# Patient Record
Sex: Male | Born: 1975 | Race: White | Hispanic: No | Marital: Married | Smoking: Former smoker
Health system: Southern US, Community
[De-identification: ages and names within clinical notes are randomized; demographics above are authoritative.]

## PROBLEM LIST (undated history)

## (undated) DIAGNOSIS — M109 Gout, unspecified: Secondary | ICD-10-CM

## (undated) DIAGNOSIS — F329 Major depressive disorder, single episode, unspecified: Secondary | ICD-10-CM

## (undated) DIAGNOSIS — F32A Depression, unspecified: Secondary | ICD-10-CM

## (undated) DIAGNOSIS — I10 Essential (primary) hypertension: Secondary | ICD-10-CM

## (undated) HISTORY — PX: COLON SURGERY: SHX602

---

## 2004-06-16 ENCOUNTER — Emergency Department: Payer: Self-pay | Admitting: Emergency Medicine

## 2004-10-18 ENCOUNTER — Ambulatory Visit: Payer: Self-pay | Admitting: Specialist

## 2006-05-06 IMAGING — CT CT ABD-PELV W/O CM
1 of 3 series · 15 of 32 positions shown, 19 images · non-contrast
Comparison: none

REASON FOR EXAM: Abdominal pain, rule out kidney stone
COMMENTS:

[Series 2: stone · axial · 0.75mm/px · z∈[-581,-152]mm · 15 of 159 slices shown, 19 images]
[im 8/159  soft-tissue]
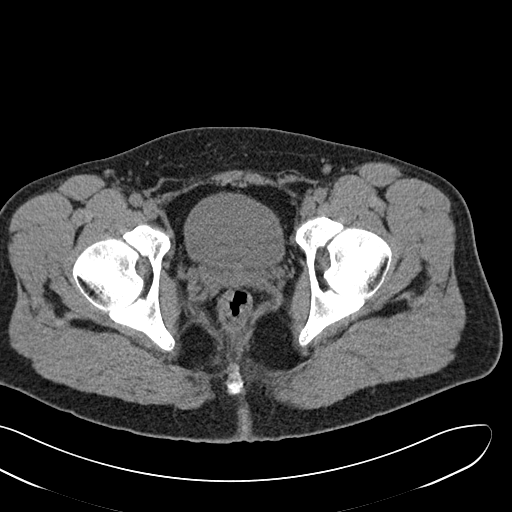
[im 8/159  bone]
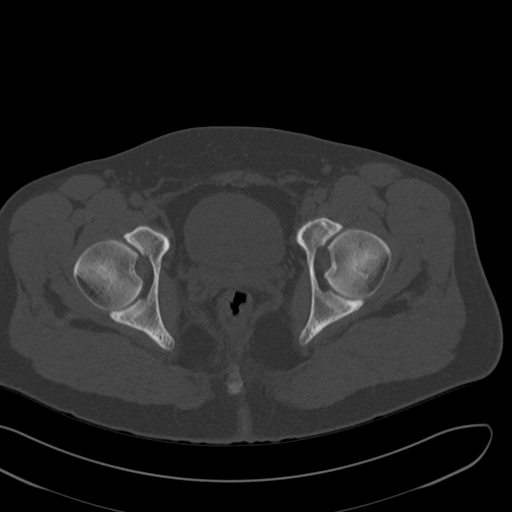
[im 22/159  soft-tissue]
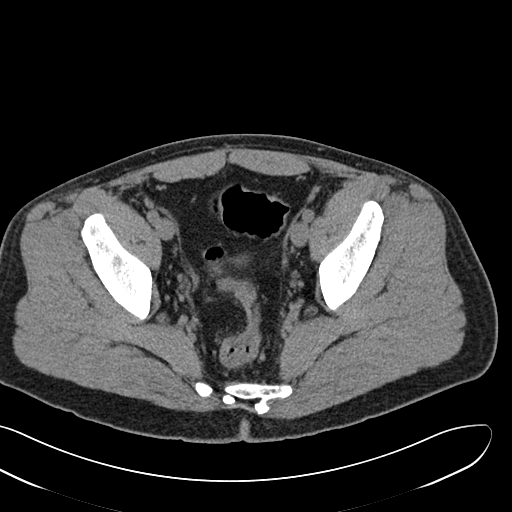
[im 36/159  soft-tissue]
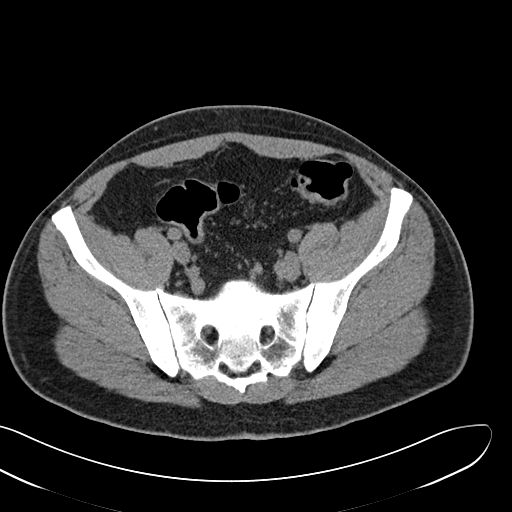
[im 44/159  soft-tissue]
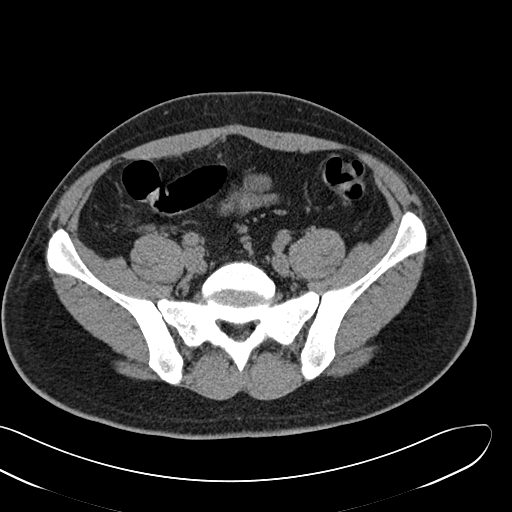
[im 58/159  soft-tissue]
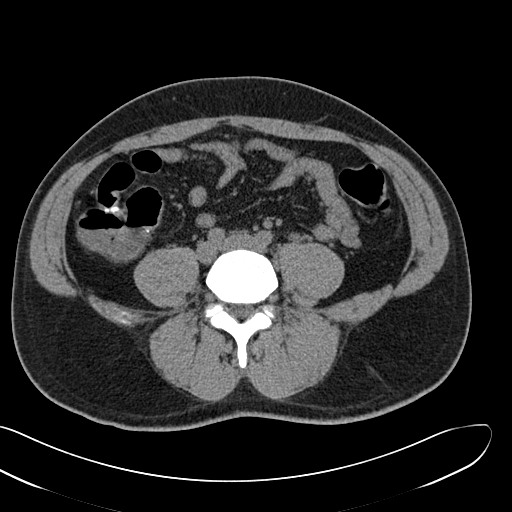
[im 65/159  soft-tissue]
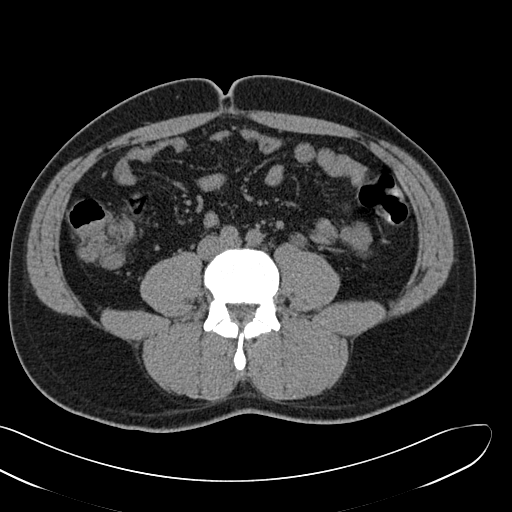
[im 80/159  soft-tissue]
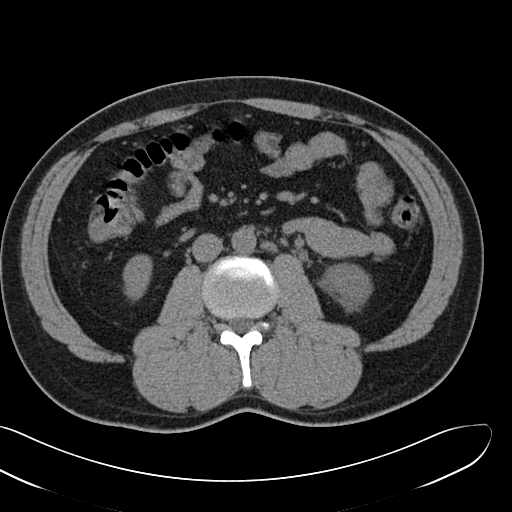
[im 94/159  soft-tissue]
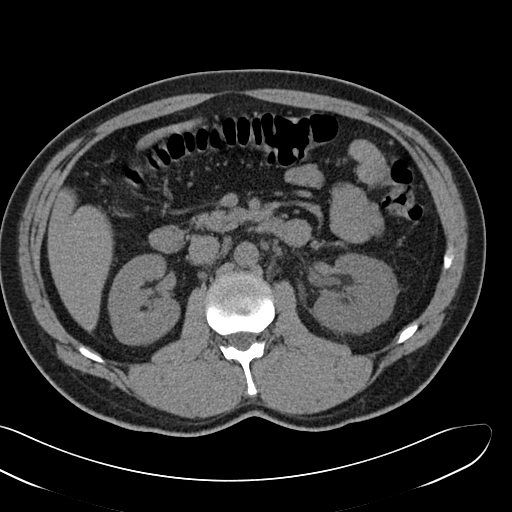
[im 101/159  soft-tissue]
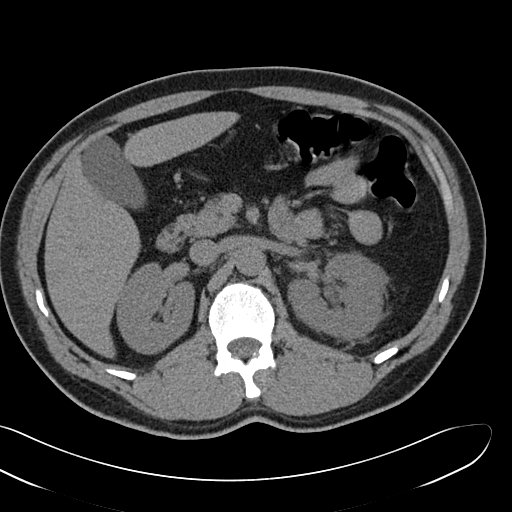
[im 101/159  bone]
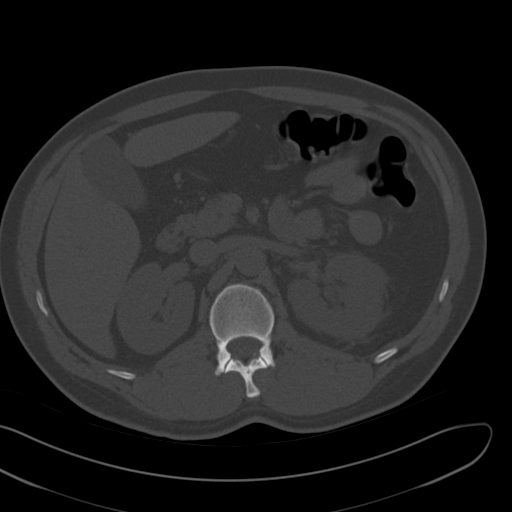
[im 115/159  soft-tissue]
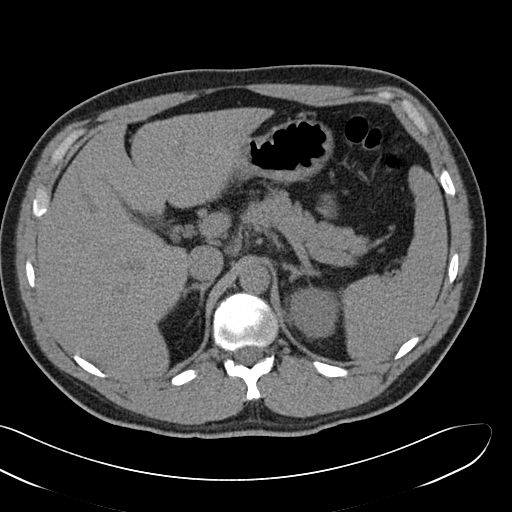
[im 123/159  soft-tissue]
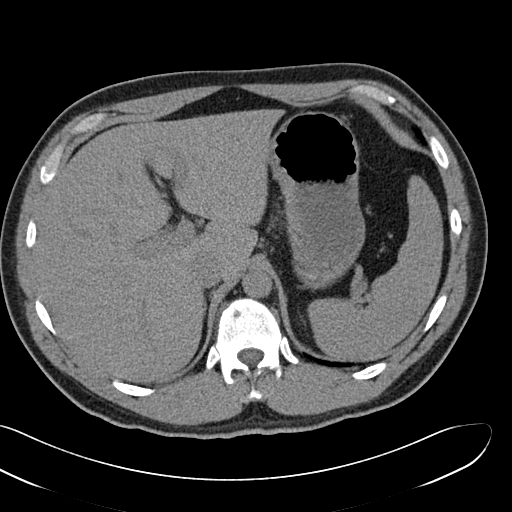
[im 130/159  lung]
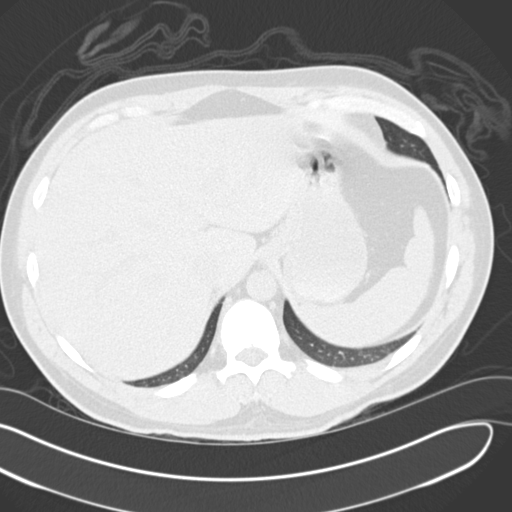
[im 137/159  soft-tissue]
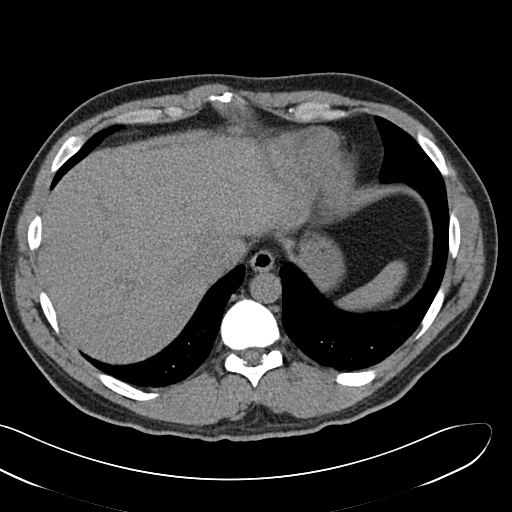
[im 137/159  lung]
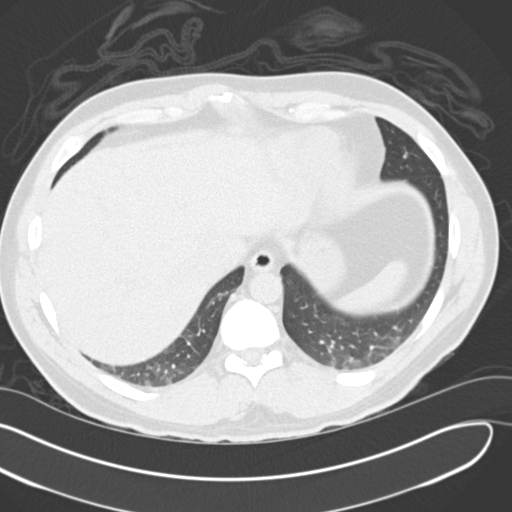
[im 144/159  lung]
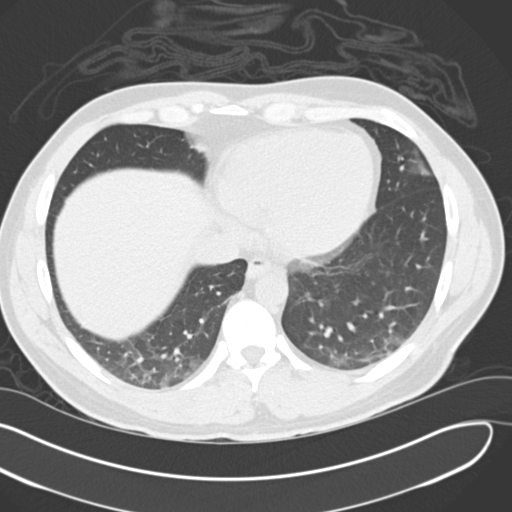
[im 151/159  soft-tissue]
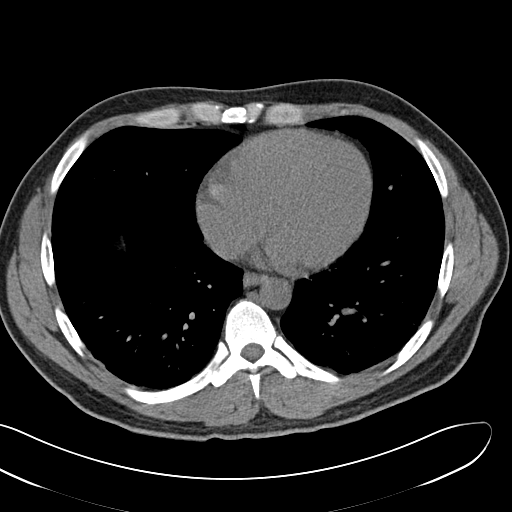
[im 151/159  lung]
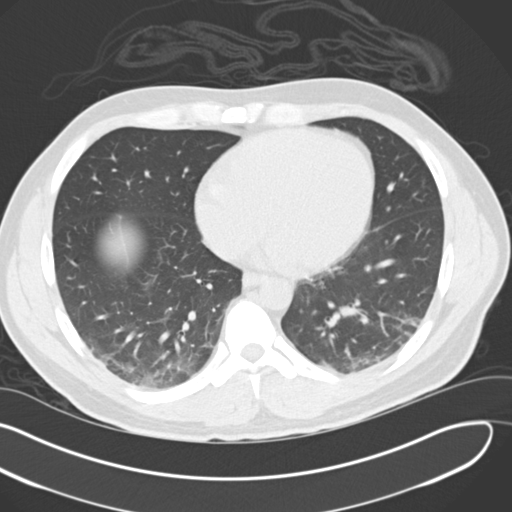

[15 of 32 positions shown; findings below may reference images not displayed]

PROCEDURE:     CT  - CT ABDOMEN AND PELVIS W[DATE]  [DATE]

RESULT:     Spiral noncontrast 3.0 mm sections were obtained from the lung
bases through the pubic symphysis.

Evaluation of the lung bases demonstrates hypoventilation versus scarring
within the posterior RIGHT and LEFT lung bases.

Evaluation of the LEFT kidney demonstrates mild to moderate
pelvocaliectasis, more mild than moderate. A small, non-obstructing, 1.0 to
2.0 mm calculus is demonstrated within the mid pole posterior medullary
portion of the LEFT kidney. There is mild prominence of the LEFT ureter and
projecting in the region of the distal LEFT ureter, at the level of the
pelvic inlet, is a 5.6 mm calcified density consistent with a distal LEFT
ureteral calculus.

Noncontrast evaluation of the liver, spleen, pancreas, adrenals and RIGHT
kidney are unremarkable. There is no evidence of abdominal or pelvic free
fluid, drainable loculated fluid collections, masses or adenopathy.
IMPRESSION: 1.     Findings consistent with a distal LEFT ureteral calculus with
associated mild to moderate (more mild than moderate) obstructive uropathy.
2.     Note, not mentioned above, inflammatory change is appreciated
surrounding the LEFT kidney consistent with the previously described
findings.
3.     Hypoventilation versus atelectasis and/or scarring within the lung
bases.
4.     Dr. Stainless of the Emergency Department was informed of these findings
at the time of the interpretation.

## 2006-09-07 IMAGING — CR DG ABDOMEN 1V
1 series · 2 of 2 positions shown · non-contrast
Comparison: none

REASON FOR EXAM: renal calculi-lithotripsy
COMMENTS:

[Series 1: view not recorded · 0.17mm/px · 2 of 2 slices shown]
[im 1/2]
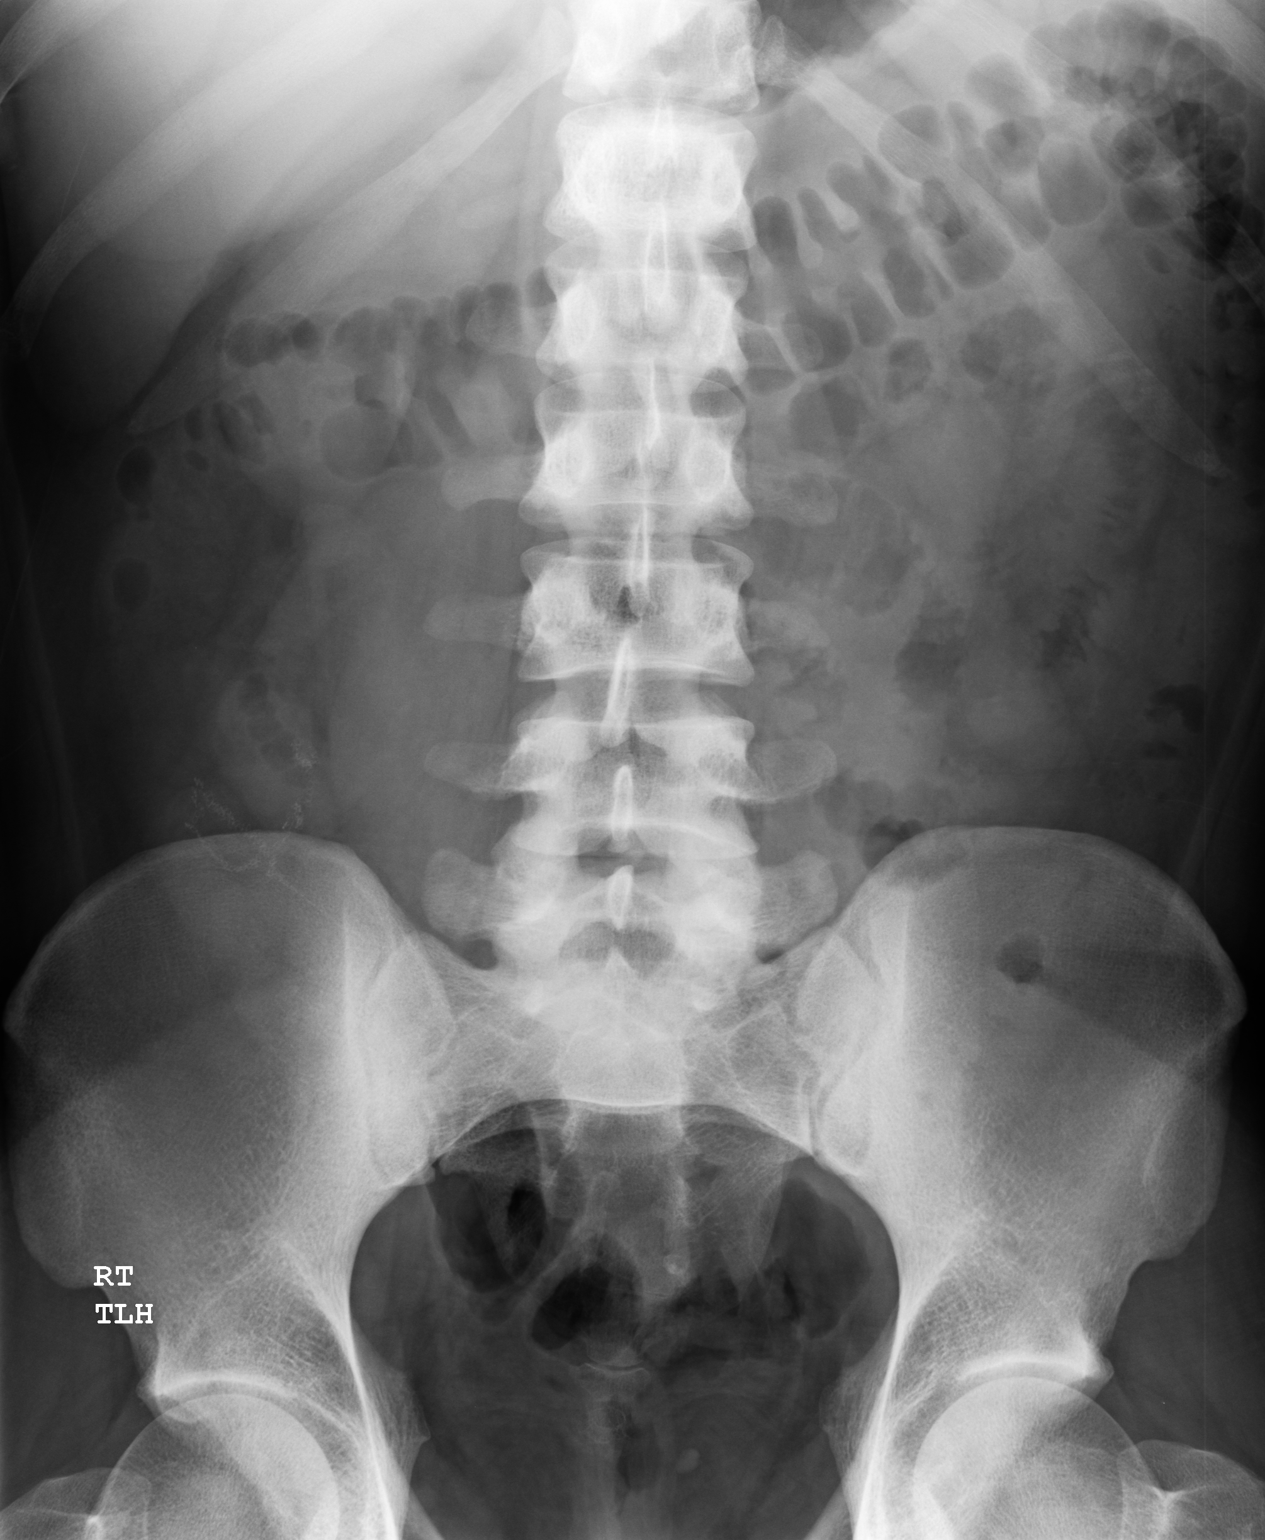
[im 2/2]
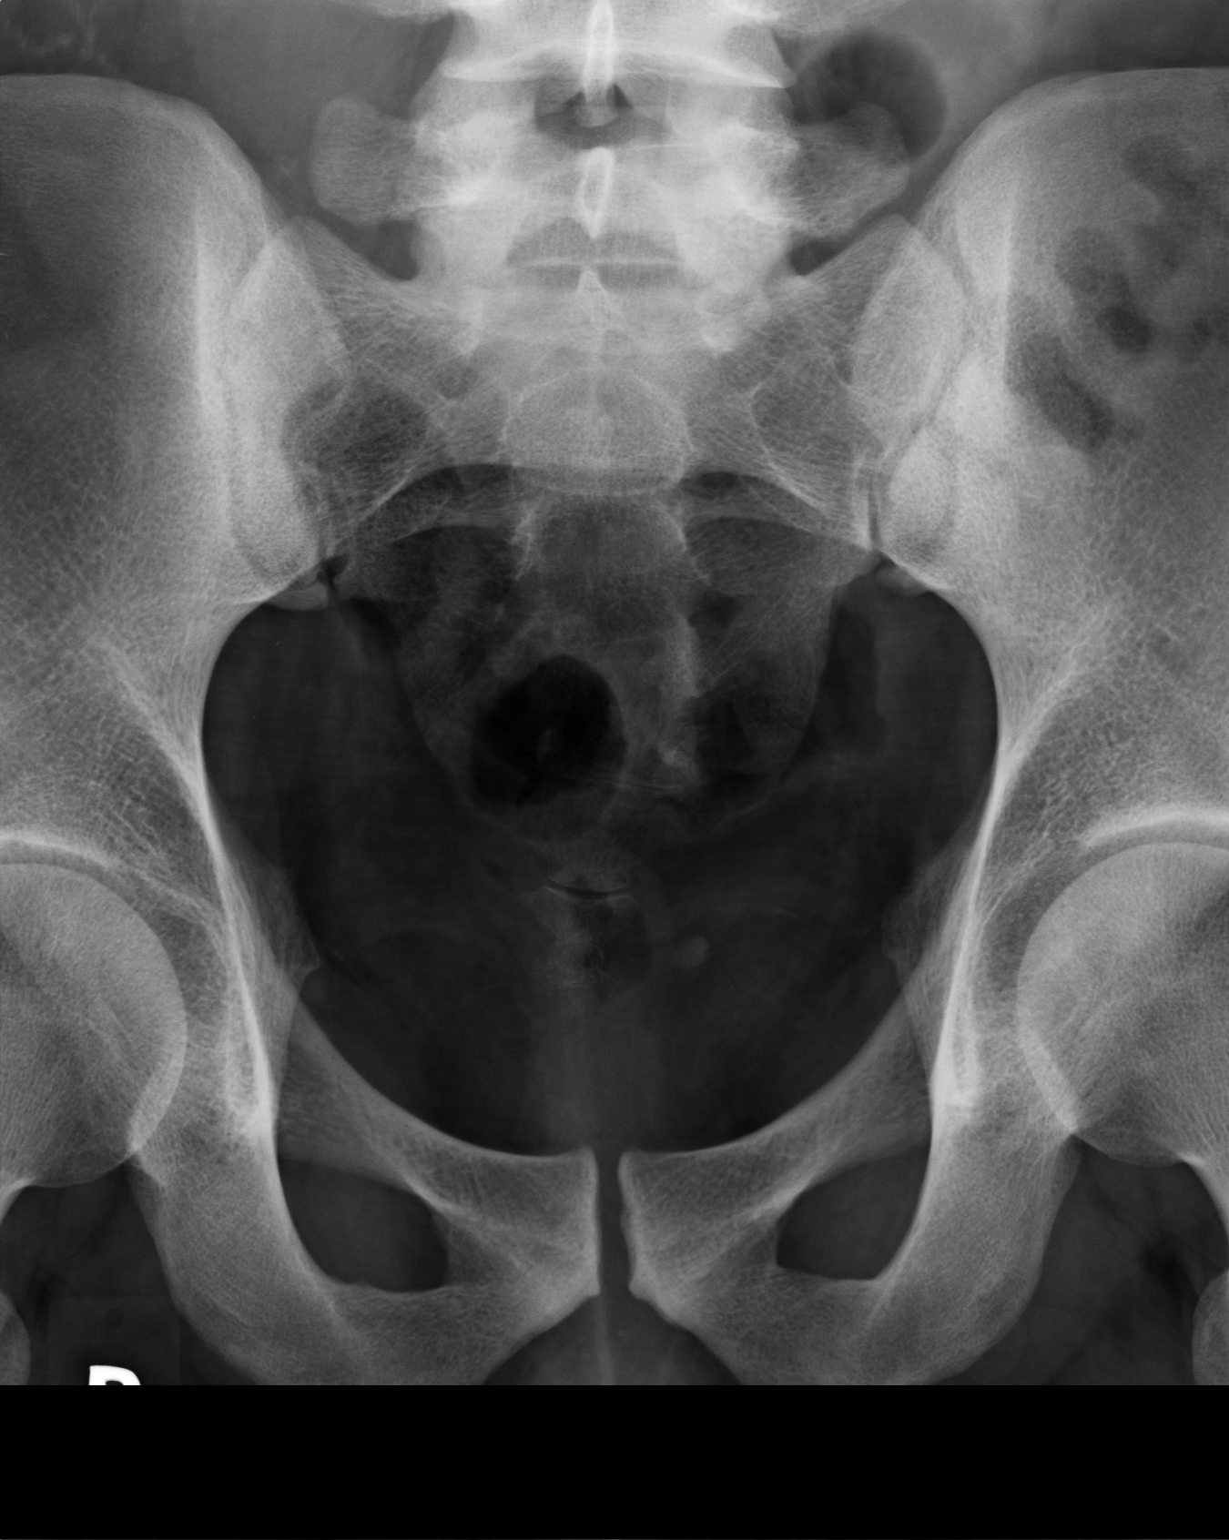

[2 of 2 positions shown; findings below may reference images not displayed]

PROCEDURE:     DXR - DXR KIDNEY URETER BLADDER  - October 18, 2004 [DATE]

RESULT:     Soft tissue structures are unremarkable.  The gas pattern is
nonspecific.  Surgical sutures are noted in the RIGHT lower quadrant.  No
bony abnormality is identified.  A rounded calcification is noted in the
LEFT pelvis.  This could be a distal LEFT ureteral stone or bladder stone.
This rounded calcification measures 8 mm.

TER/lac
IMPRESSION: See above

## 2016-05-20 ENCOUNTER — Other Ambulatory Visit: Payer: Self-pay | Admitting: Physician Assistant

## 2016-05-20 DIAGNOSIS — N281 Cyst of kidney, acquired: Secondary | ICD-10-CM

## 2016-05-27 ENCOUNTER — Other Ambulatory Visit: Payer: Self-pay | Admitting: Urology

## 2016-05-27 DIAGNOSIS — N281 Cyst of kidney, acquired: Secondary | ICD-10-CM

## 2016-05-29 ENCOUNTER — Ambulatory Visit
Admission: RE | Admit: 2016-05-29 | Discharge: 2016-05-29 | Disposition: A | Discharge status: Home / Self Care | Payer: BLUE CROSS/BLUE SHIELD | Source: Ambulatory Visit | Attending: Urology | Admitting: Urology

## 2016-05-29 DIAGNOSIS — N132 Hydronephrosis with renal and ureteral calculous obstruction: Secondary | ICD-10-CM | POA: Insufficient documentation

## 2016-05-29 DIAGNOSIS — N281 Cyst of kidney, acquired: Secondary | ICD-10-CM | POA: Insufficient documentation

## 2016-05-29 DIAGNOSIS — N201 Calculus of ureter: Secondary | ICD-10-CM | POA: Diagnosis not present

## 2016-05-29 DIAGNOSIS — N23 Unspecified renal colic: Secondary | ICD-10-CM | POA: Insufficient documentation

## 2016-05-29 MED ORDER — GADOBENATE DIMEGLUMINE 529 MG/ML IV SOLN
20.0000 mL | Freq: Once | INTRAVENOUS | Status: AC | PRN
  Administered 2016-05-29: 20 mL via INTRAVENOUS

## 2016-10-08 ENCOUNTER — Encounter: Payer: Self-pay | Admitting: *Deleted

## 2016-10-08 ENCOUNTER — Ambulatory Visit: Payer: BLUE CROSS/BLUE SHIELD | Admitting: Anesthesiology

## 2016-10-08 ENCOUNTER — Ambulatory Visit
Admission: RE | Admit: 2016-10-08 | Discharge: 2016-10-08 | Disposition: A | Discharge status: Home / Self Care | Payer: BLUE CROSS/BLUE SHIELD | Source: Ambulatory Visit | Attending: Urology | Admitting: Urology

## 2016-10-08 ENCOUNTER — Encounter
Admission: RE | Disposition: A | Discharge status: Home / Self Care | Payer: Self-pay | Source: Ambulatory Visit | Attending: Urology

## 2016-10-08 DIAGNOSIS — I10 Essential (primary) hypertension: Secondary | ICD-10-CM | POA: Insufficient documentation

## 2016-10-08 DIAGNOSIS — N136 Pyonephrosis: Secondary | ICD-10-CM | POA: Insufficient documentation

## 2016-10-08 DIAGNOSIS — F329 Major depressive disorder, single episode, unspecified: Secondary | ICD-10-CM | POA: Insufficient documentation

## 2016-10-08 DIAGNOSIS — Z79899 Other long term (current) drug therapy: Secondary | ICD-10-CM | POA: Diagnosis not present

## 2016-10-08 DIAGNOSIS — Z87442 Personal history of urinary calculi: Secondary | ICD-10-CM | POA: Insufficient documentation

## 2016-10-08 DIAGNOSIS — N132 Hydronephrosis with renal and ureteral calculous obstruction: Secondary | ICD-10-CM | POA: Diagnosis present

## 2016-10-08 DIAGNOSIS — M109 Gout, unspecified: Secondary | ICD-10-CM | POA: Diagnosis not present

## 2016-10-08 DIAGNOSIS — K5090 Crohn's disease, unspecified, without complications: Secondary | ICD-10-CM | POA: Insufficient documentation

## 2016-10-08 DIAGNOSIS — Z87891 Personal history of nicotine dependence: Secondary | ICD-10-CM | POA: Diagnosis not present

## 2016-10-08 HISTORY — DX: Gout, unspecified: M10.9

## 2016-10-08 HISTORY — PX: URETEROSCOPY WITH HOLMIUM LASER LITHOTRIPSY: SHX6645

## 2016-10-08 HISTORY — DX: Depression, unspecified: F32.A

## 2016-10-08 HISTORY — DX: Major depressive disorder, single episode, unspecified: F32.9

## 2016-10-08 HISTORY — DX: Essential (primary) hypertension: I10

## 2016-10-08 HISTORY — PX: CYSTOSCOPY WITH STENT PLACEMENT: SHX5790

## 2016-10-08 SURGERY — URETEROSCOPY, WITH LITHOTRIPSY USING HOLMIUM LASER
Anesthesia: General | Laterality: Right | Wound class: Clean Contaminated

## 2016-10-08 MED ORDER — ONDANSETRON HCL 4 MG/2ML IJ SOLN
INTRAMUSCULAR | Status: AC
  Filled 2016-10-08: qty 2

## 2016-10-08 MED ORDER — LACTATED RINGERS IV SOLN
INTRAVENOUS | Status: DC
  Administered 2016-10-08 (×2): 50 mL/h via INTRAVENOUS
  Administered 2016-10-08: 13:00:00 via INTRAVENOUS

## 2016-10-08 MED ORDER — CEFAZOLIN SODIUM-DEXTROSE 1-4 GM/50ML-% IV SOLN
1.0000 g | Freq: Once | INTRAVENOUS | Status: AC
  Administered 2016-10-08: 1 g via INTRAVENOUS

## 2016-10-08 MED ORDER — ROCURONIUM BROMIDE 50 MG/5ML IV SOLN
INTRAVENOUS | Status: AC
  Filled 2016-10-08: qty 1

## 2016-10-08 MED ORDER — MIDAZOLAM HCL 2 MG/2ML IJ SOLN
INTRAMUSCULAR | Status: DC | PRN
  Administered 2016-10-08: 2 mg via INTRAVENOUS

## 2016-10-08 MED ORDER — LIDOCAINE HCL 2 % EX GEL
CUTANEOUS | Status: AC
  Filled 2016-10-08: qty 10

## 2016-10-08 MED ORDER — HYOSCYAMINE SULFATE SL 0.125 MG SL SUBL: 0.1250 mg | SUBLINGUAL_TABLET | SUBLINGUAL | 2 refills | Status: AC | PRN

## 2016-10-08 MED ORDER — DOCUSATE SODIUM 100 MG PO CAPS: 200.0000 mg | ORAL_CAPSULE | Freq: Two times a day (BID) | ORAL | 3 refills | Status: AC

## 2016-10-08 MED ORDER — FENTANYL CITRATE (PF) 100 MCG/2ML IJ SOLN
INTRAMUSCULAR | Status: AC
  Filled 2016-10-08: qty 2

## 2016-10-08 MED ORDER — LIDOCAINE HCL (PF) 2 % IJ SOLN
INTRAMUSCULAR | Status: AC
  Filled 2016-10-08: qty 2

## 2016-10-08 MED ORDER — PROMETHAZINE HCL 25 MG RE SUPP: 25.0000 mg | Freq: Four times a day (QID) | RECTAL | 3 refills | Status: AC | PRN

## 2016-10-08 MED ORDER — FENTANYL CITRATE (PF) 100 MCG/2ML IJ SOLN
INTRAMUSCULAR | Status: DC | PRN
  Administered 2016-10-08: 100 ug via INTRAVENOUS
  Administered 2016-10-08: 50 ug via INTRAVENOUS

## 2016-10-08 MED ORDER — PROPOFOL 10 MG/ML IV BOLUS
INTRAVENOUS | Status: DC | PRN
  Administered 2016-10-08: 200 mg via INTRAVENOUS

## 2016-10-08 MED ORDER — PHENYLEPHRINE HCL 10 MG/ML IJ SOLN
INTRAMUSCULAR | Status: DC | PRN
  Administered 2016-10-08: 100 ug via INTRAVENOUS

## 2016-10-08 MED ORDER — CEFAZOLIN SODIUM-DEXTROSE 1-4 GM/50ML-% IV SOLN
INTRAVENOUS | Status: AC
  Filled 2016-10-08: qty 50

## 2016-10-08 MED ORDER — KETOROLAC TROMETHAMINE 30 MG/ML IJ SOLN
INTRAMUSCULAR | Status: DC | PRN
  Administered 2016-10-08: 30 mg via INTRAVENOUS

## 2016-10-08 MED ORDER — FENTANYL CITRATE (PF) 100 MCG/2ML IJ SOLN: 25.0000 ug | INTRAMUSCULAR | Status: DC | PRN

## 2016-10-08 MED ORDER — MIDAZOLAM HCL 2 MG/2ML IJ SOLN
INTRAMUSCULAR | Status: AC
  Filled 2016-10-08: qty 2

## 2016-10-08 MED ORDER — TAPENTADOL HCL 50 MG PO TABS
50.0000 mg | ORAL_TABLET | Freq: Four times a day (QID) | ORAL | Status: DC | PRN
  Administered 2016-10-08: 50 mg via ORAL

## 2016-10-08 MED ORDER — DEXAMETHASONE SODIUM PHOSPHATE 10 MG/ML IJ SOLN
INTRAMUSCULAR | Status: DC | PRN
  Administered 2016-10-08: 10 mg via INTRAVENOUS

## 2016-10-08 MED ORDER — LIDOCAINE HCL (CARDIAC) 20 MG/ML IV SOLN
INTRAVENOUS | Status: DC | PRN
  Administered 2016-10-08: 100 mg via INTRAVENOUS

## 2016-10-08 MED ORDER — PROPOFOL 10 MG/ML IV BOLUS
INTRAVENOUS | Status: AC
  Filled 2016-10-08: qty 20

## 2016-10-08 MED ORDER — SUCCINYLCHOLINE CHLORIDE 20 MG/ML IJ SOLN
INTRAMUSCULAR | Status: AC
  Filled 2016-10-08: qty 1

## 2016-10-08 MED ORDER — NUCYNTA 50 MG PO TABS: 50.0000 mg | ORAL_TABLET | Freq: Four times a day (QID) | ORAL | 0 refills | Status: AC | PRN

## 2016-10-08 MED ORDER — SUGAMMADEX SODIUM 500 MG/5ML IV SOLN
INTRAVENOUS | Status: DC | PRN
  Administered 2016-10-08: 210 mg via INTRAVENOUS

## 2016-10-08 MED ORDER — KETOROLAC TROMETHAMINE 30 MG/ML IJ SOLN
INTRAMUSCULAR | Status: AC
  Filled 2016-10-08: qty 1

## 2016-10-08 MED ORDER — ROCURONIUM BROMIDE 100 MG/10ML IV SOLN
INTRAVENOUS | Status: DC | PRN
  Administered 2016-10-08: 40 mg via INTRAVENOUS

## 2016-10-08 MED ORDER — ONDANSETRON HCL 4 MG/2ML IJ SOLN
INTRAMUSCULAR | Status: DC | PRN
  Administered 2016-10-08: 4 mg via INTRAVENOUS

## 2016-10-08 MED ORDER — DEXAMETHASONE SODIUM PHOSPHATE 10 MG/ML IJ SOLN
INTRAMUSCULAR | Status: AC
  Filled 2016-10-08: qty 1

## 2016-10-08 MED ORDER — ONDANSETRON HCL 4 MG/2ML IJ SOLN
4.0000 mg | Freq: Once | INTRAMUSCULAR | Status: AC | PRN
  Administered 2016-10-08: 4 mg via INTRAVENOUS

## 2016-10-08 MED ORDER — LEVOFLOXACIN 500 MG PO TABS: 500.0000 mg | ORAL_TABLET | Freq: Every day | ORAL | 1 refills | Status: AC

## 2016-10-08 MED ORDER — LIDOCAINE HCL 2 % EX GEL
CUTANEOUS | Status: DC | PRN
  Administered 2016-10-08: 1

## 2016-10-08 MED ORDER — TAPENTADOL HCL 50 MG PO TABS
ORAL_TABLET | ORAL | Status: AC
  Administered 2016-10-08: 50 mg via ORAL
  Filled 2016-10-08: qty 1

## 2016-10-08 SURGICAL SUPPLY — 22 items
BAG DRAIN CYSTO-URO LG1000N (MISCELLANEOUS) ×4 IMPLANT
CATH URETL 5X70 OPEN END (CATHETERS) ×4 IMPLANT
CNTNR SPEC 2.5X3XGRAD LEK (MISCELLANEOUS) ×2
CONRAY 43 FOR UROLOGY 50M (MISCELLANEOUS) ×4 IMPLANT
CONT SPEC 4OZ STER OR WHT (MISCELLANEOUS) ×2
CONTAINER SPEC 2.5X3XGRAD LEK (MISCELLANEOUS) ×2 IMPLANT
FIBER LASER 365 (Laser) ×4 IMPLANT
GLOVE BIO SURGEON STRL SZ7 (GLOVE) ×8 IMPLANT
GOWN STRL REUS W/ TWL LRG LVL4 (GOWN DISPOSABLE) ×2 IMPLANT
GOWN STRL REUS W/TWL LRG LVL4 (GOWN DISPOSABLE) ×2
GOWN STRL REUS W/TWL XL LVL4 (GOWN DISPOSABLE) ×4 IMPLANT
GUIDEWIRE STR ZIPWIRE 035X150 (MISCELLANEOUS) ×4 IMPLANT
KIT RM TURNOVER CYSTO AR (KITS) ×4 IMPLANT
PACK CYSTO AR (MISCELLANEOUS) ×4 IMPLANT
SET CYSTO W/LG BORE CLAMP LF (SET/KITS/TRAYS/PACK) ×4 IMPLANT
SOL .9 NS 3000ML IRR  AL (IV SOLUTION) ×2
SOL .9 NS 3000ML IRR UROMATIC (IV SOLUTION) ×2 IMPLANT
SOL PREP PVP 2OZ (MISCELLANEOUS) ×4
SOLUTION PREP PVP 2OZ (MISCELLANEOUS) ×2 IMPLANT
STENT URET 6FRX26 CONTOUR (STENTS) ×4 IMPLANT
SURGILUBE 2OZ TUBE FLIPTOP (MISCELLANEOUS) ×4 IMPLANT
WATER STERILE IRR 1000ML POUR (IV SOLUTION) ×4 IMPLANT

## 2016-10-08 NOTE — H&P (Signed)
Date of Initial H&P: 10/08/16  History reviewed, patient examined, no change in status, stable for surgery.

## 2016-10-08 NOTE — Anesthesia Procedure Notes (Signed)
Procedure Name: Intubation Date/Time: 10/08/2016 1:18 PM Performed by: Silvana Newness Pre-anesthesia Checklist: Patient identified, Emergency Drugs available, Suction available, Patient being monitored and Timeout performed Patient Re-evaluated:Patient Re-evaluated prior to induction Oxygen Delivery Method: Circle system utilized Preoxygenation: Pre-oxygenation with 100% oxygen Induction Type: IV induction Ventilation: Mask ventilation without difficulty Laryngoscope Size: Mac and 4 Grade View: Grade II Tube type: Oral Tube size: 7.5 mm Number of attempts: 1 Airway Equipment and Method: Stylet Placement Confirmation: ETT inserted through vocal cords under direct vision,  positive ETCO2 and breath sounds checked- equal and bilateral Secured at: 23 cm Tube secured with: Tape Dental Injury: Teeth and Oropharynx as per pre-operative assessment

## 2016-10-08 NOTE — Anesthesia Preprocedure Evaluation (Addendum)
Anesthesia Evaluation  Patient identified by MRN, date of birth, ID band Patient awake    Reviewed: Allergy & Precautions, NPO status , Patient's Chart, lab work & pertinent test results  Airway Mallampati: II  TM Distance: >3 FB     Dental   Pulmonary former smoker,    Pulmonary exam normal        Cardiovascular hypertension, Pt. on medications Normal cardiovascular exam     Neuro/Psych PSYCHIATRIC DISORDERS Depression negative neurological ROS     GI/Hepatic negative GI ROS, Neg liver ROS,   Endo/Other  negative endocrine ROS  Renal/GU negative Renal ROS  negative genitourinary   Musculoskeletal negative musculoskeletal ROS (+)   Abdominal Normal abdominal exam  (+)   Peds negative pediatric ROS (+)  Hematology negative hematology ROS (+)   Anesthesia Other Findings   Reproductive/Obstetrics                             Anesthesia Physical Anesthesia Plan  ASA: II  Anesthesia Plan: General   Post-op Pain Management:    Induction: Intravenous  PONV Risk Score and Plan: 3 and Ondansetron, Dexamethasone, Midazolam and Propofol infusion  Airway Management Planned: Oral ETT  Additional Equipment:   Intra-op Plan:   Post-operative Plan: Extubation in OR  Informed Consent: I have reviewed the patients History and Physical, chart, labs and discussed the procedure including the risks, benefits and alternatives for the proposed anesthesia with the patient or authorized representative who has indicated his/her understanding and acceptance.   Dental advisory given  Plan Discussed with: CRNA and Surgeon  Anesthesia Plan Comments:        Anesthesia Quick Evaluation

## 2016-10-08 NOTE — OR Nursing (Signed)
Dr. Evelene Croon took stone fragments with him.

## 2016-10-08 NOTE — Discharge Instructions (Signed)
Ureteroscopy, Care After This sheet gives you information about how to care for yourself after your procedure. Your health care provider may also give you more specific instructions. If you have problems or questions, contact your health care provider. What can I expect after the procedure? After the procedure, it is common to have:  A burning sensation when you urinate.  Blood in your urine.  Mild discomfort in the bladder area or kidney area when urinating.  Needing to urinate more often or urgently.  Follow these instructions at home: Medicines  Take over-the-counter and prescription medicines only as told by your health care provider.  If you were prescribed an antibiotic medicine, take it as told by your health care provider. Do not stop taking the antibiotic even if you start to feel better. General instructions   Donot drive for 24 hours if you were given a medicine to help you relax (sedative) during your procedure.  To relieve burning, try taking a warm bath or holding a warm washcloth over your groin.  Drink enough fluid to keep your urine clear or pale yellow. ? Drink two 8-ounce glasses of water every hour for the first 2 hours after you get home. ? Continue to drink water often at home.  You can eat what you usually do.  Keep all follow-up visits as told by your health care provider. This is important. ? If you had a tube placed to keep urine flowing (ureteral stent), ask your health care provider when you need to return to have it removed. Contact a health care provider if:  You have chills or a fever.  You have burning pain for longer than 24 hours after the procedure.  You have blood in your urine for longer than 24 hours after the procedure. Get help right away if:  You have large amounts of blood in your urine.  You have blood clots in your urine.  You have very bad pain.  You have chest pain or trouble breathing.  You are unable to urinate and  you have the feeling of a full bladder. This information is not intended to replace advice given to you by your health care provider. Make sure you discuss any questions you have with your health care provider. Document Released: 02/02/2013 Document Revised: 11/14/2015 Document Reviewed: 11/10/2015 Elsevier Interactive Patient Education  2018 [employer] Ureteral Stent Implantation, Care After Refer to this sheet in the next few weeks. These instructions provide you with information about caring for yourself after your procedure. Your health care provider may also give you more specific instructions. Your treatment has been planned according to current medical practices, but problems sometimes occur. Call your health care provider if you have any problems or questions after your procedure. What can I expect after the procedure? After the procedure, it is common to have:  Nausea.  Mild pain when you urinate. You may feel this pain in your lower back or lower abdomen. Pain should stop within a few minutes after you urinate. This may last for up to 1 week.  A small amount of blood in your urine for several days.  Follow these instructions at home:  Medicines  Take over-the-counter and prescription medicines only as told by your health care provider.  If you were prescribed an antibiotic medicine, take it as told by your health care provider. Do not stop taking the antibiotic even if you start to feel better.  Do not drive for 24 hours if you received a  sedative.  Do not drive or operate heavy machinery while taking prescription pain medicines. Activity  Return to your normal activities as told by your health care provider. Ask your health care provider what activities are safe for you.  Do not lift anything that is heavier than 10 lb (4.5 kg). Follow this limit for 1 week after your procedure, or for as long as told by your health care provider. General instructions  Watch for any  blood in your urine. Call your health care provider if the amount of blood in your urine increases.  If you have a catheter: ? Follow instructions from your health care provider about taking care of your catheter and collection bag. ? Do not take baths, swim, or use a hot tub until your health care provider approves.  Drink enough fluid to keep your urine clear or pale yellow.  Keep all follow-up visits as told by your health care provider. This is important. Contact a health care provider if:  You have pain that gets worse or does not get better with medicine, especially pain when you urinate.  You have difficulty urinating.  You feel nauseous or you vomit repeatedly during a period of more than 2 days after the procedure. Get help right away if:  Your urine is dark red or has blood clots in it.  You are leaking urine (have incontinence).  The end of the stent comes out of your urethra.  You cannot urinate.  You have sudden, sharp, or severe pain in your abdomen or lower back.  You have a fever. This information is not intended to replace advice given to you by your health care provider. Make sure you discuss any questions you have with your health care provider. Document Released: 09/30/2012 Document Revised: 07/06/2015 Document Reviewed: 08/12/2014 Elsevier Interactive Patient Education  2018 [employer]  AMBULATORY SURGERY  DISCHARGE INSTRUCTIONS   1) The drugs that you were given will stay in your system until tomorrow so for the next 24 hours you should not:  A) Drive an automobile B) Make any legal decisions C) Drink any alcoholic beverage   2) You may resume regular meals tomorrow.  Today it is better to start with liquids and gradually work up to solid foods.  You may eat anything you prefer, but it is better to start with liquids, then soup and crackers, and gradually work up to solid foods.   3) Please notify your doctor immediately if you have any  unusual bleeding, trouble breathing, redness and pain at the surgery site, drainage, fever, or pain not relieved by medication.    4) Additional Instructions:    Please contact your physician with any problems or Same Day Surgery at [phone], Monday through Friday 6 am to 4 pm, or [place] at [facility] number at [phone].

## 2016-10-08 NOTE — Transfer of Care (Signed)
Immediate Anesthesia Transfer of Care Note  Patient: Brian Gallagher  Procedure(s) Performed: Procedure(s): URETEROSCOPY WITH HOLMIUM LASER LITHOTRIPSY (Right) CYSTOSCOPY WITH STENT PLACEMENT (N/A)  Patient Location: PACU  Anesthesia Type:General  Level of Consciousness: drowsy and patient cooperative  Airway & Oxygen Therapy: Patient Spontanous Breathing and Patient connected to face mask oxygen  Post-op Assessment: Report given to RN, Post -op Vital signs reviewed and stable and Patient moving all extremities X 4  Post vital signs: Reviewed and stable  Last Vitals:  Vitals:   10/08/16 1210 10/08/16 1417  BP: (!) 170/93 (!) 146/72  Pulse: 60 89  Resp: 16 17  Temp: 36.8 C 36.8 C  SpO2: 100% 100%    Last Pain:  Vitals:   10/08/16 1210  TempSrc: Oral         Complications: No apparent anesthesia complications

## 2016-10-08 NOTE — H&P (Signed)
NAME:  RAMOS, SHEPPARD                     ACCOUNT NO.:  MEDICAL RECORD NO.:  [id number]  LOCATION:                                 FACILITY:  PHYSICIAN:  Anola Gurney          DATE OF BIRTH:  [birth year]  DATE OF ADMISSION: DATE OF DISCHARGE:                            HISTORY AND PHYSICAL   __________  Same-day surgery scheduled, August 28th.  CHIEF COMPLAINT:  Right flank pain.  HISTORY OF PRESENT ILLNESS:  Mr. Mercadel is a 41 year old white male with sudden onset of right flank pain associated with nausea and vomiting on August 26th.  He had CT scan performed on August 27th, which indicated the presence of a 4 x 6 mm right UVJ stone with hydronephrosis.  The patient comes in now for right ureteroscopic ureterolithotomy with holmium laser lithotripsy.  ALLERGIES:  NO DRUG ALLERGIES.  CURRENT MEDICATIONS:  Cholestyramine, vitamin B12, losartan, Lexapro, colchicine, Stelara, Percocet, and tamsulosin.  SURGICAL HISTORY: 1. Colon resections in 2004 and 2011. 2. Lithotripsy in 2006.  SOCIAL HISTORY:  The patient quit smoking 10 years ago with a 15-pack- year history.  He denied alcohol use.  FAMILY HISTORY:  Mother is living with unspecified kidney disease.  PAST AND CURRENT MEDICAL CONDITIONS: 1. Crohn disease. 2. Hypertension. 3. Depression. 4. Gout. 5. History of kidney stone disease.  REVIEW OF SYSTEMS:  The patient has intermittent and chronic diarrhea. No chest pain, short shortness of breath, diabetes, or stroke.  PHYSICAL EXAMINATION:  GENERAL:  A well-nourished white male in no acute distress. HEENT:  Sclerae clear. NECK:  Supple.  No palpable cervical adenopathy. LUNGS:  Clear to auscultation. CARDIOVASCULAR:  Regular rhythm and rate without audible murmurs. ABDOMINAL:  Soft and nontender abdomen. GENITOURINARY AND RECTAL:  Deferred. NEUROMUSCULAR:  Alert and oriented x3.  IMPRESSION:  Right ureterovesical junction stone.  PLAN:  Right ureteroscopic  ureterolithotomy with a holmium laser lithotripsy.          ______________________________ Anola Gurney     MW/MEDQ  D:  10/08/2016  T:  10/08/2016  Job:  [id number]

## 2016-10-08 NOTE — Op Note (Signed)
Preoperative diagnosis:1. Right ureterolithiasis                                           2. Right hydronephrosis                                           3. Right pyonephrosis  Postoperative diagnosis: same   Procedure:1. Right ureteroscopic ureterolithotomy with holmium laser lithotripsy                     2. Right double pigtail ureteral stent placement                     3. Fluoroscopy    Surgeon: Suszanne Conners. Evelene Croon MD  Anesthesia: General  Indications:See the history and physical. After informed consent the above procedure(s) were requested     Technique and findings: After adequate general anesthesia had been obtained the patient was placed into dorsal lithotomy position and the perineum was prepped and draped in the usual fashion.  Fluoroscopy confirmed the presence of a distal 8 mm right ureteral stone. The 4.5 semirigid short ureteroscope was then coupled to the camera and visually advanced into the distal ureter. The stone was encountered and the 365  holmium laser fiber delivered to the level of the stone. The power was set at 0.6 J and frequency of 10 Hz. The stone was powdered and 0.035 Glidewire advanced up the ureter using fluoroscopic guidance. Purulent material was seen exiting the ureter, consistent with pyonephrosis. The ureteroscope was removed taking care to leave the guidewire in position. The cystoscope was backloaded over the guidewire and a 6 x 26 cm double pigtail ureteral stent with retrieval suture advanced into the ureter under fluoroscopic guidance. The guidewire was removed taking care leaving stent in position. The bladder was drained and the cystoscope was removed. 10 cc of viscous Xylocaine was in the urethra. The procedure was then terminated and patient transferred to the recovery room in stable condition.

## 2016-10-08 NOTE — Anesthesia Post-op Follow-up Note (Signed)
Anesthesia QCDR form completed.        

## 2016-10-09 ENCOUNTER — Encounter: Payer: Self-pay | Admitting: Urology

## 2016-10-09 NOTE — Anesthesia Postprocedure Evaluation (Signed)
Anesthesia Post Note  Patient: Brian Gallagher  Procedure(s) Performed: Procedure(s) (LRB): URETEROSCOPY WITH HOLMIUM LASER LITHOTRIPSY (Right) CYSTOSCOPY WITH STENT PLACEMENT (N/A)  Patient location during evaluation: PACU Anesthesia Type: General Level of consciousness: awake and alert and oriented Pain management: pain level controlled Vital Signs Assessment: post-procedure vital signs reviewed and stable Respiratory status: spontaneous breathing Cardiovascular status: blood pressure returned to baseline Anesthetic complications: no     Last Vitals:  Vitals:   10/08/16 1520 10/08/16 1720  BP: 130/78 128/77  Pulse: 70 62  Resp: 16 15  Temp: (!) 36.3 C   SpO2: 98% 99%    Last Pain:  Vitals:   10/08/16 1720  TempSrc:   PainSc: 2                  Jermaine Tholl

## 2018-04-18 IMAGING — MR MR ABDOMEN WO/W CM
7 of 17 series · 18 of 48 positions shown · IV contrast (20 mL MULTIHANCE)
Comparison: Unenhanced AP CT on 05/11/2016

CLINICAL DATA: Indeterminate left renal lesion on recent CT. Right
ureteral calculus and hydronephrosis.

EXAM:
MRI ABDOMEN WITHOUT AND WITH CONTRAST
TECHNIQUE: Multiplanar multisequence MR imaging of the abdomen was performed
both before and after the administration of intravenous contrast.
CONTRAST:  20mL MULTIHANCE GADOBENATE DIMEGLUMINE 529 MG/ML IV SOLN

[Series 2: T2 · coronal · 8.0mm · 1.64mm/px · 2 of 19 slices shown]
[im 1/19]
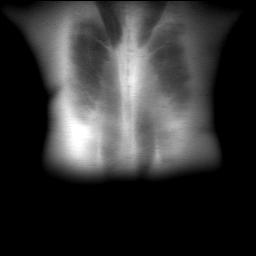
[im 19/19]
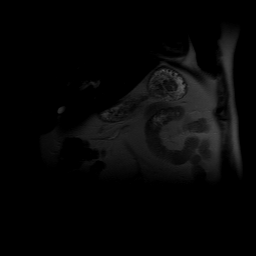

[Series 3: T2 fat-sat · axial · 8.0mm · 0.74mm/px · 1 of 21 slices shown]
[im 1/21]
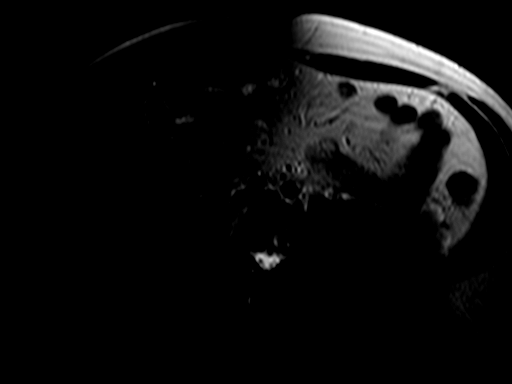

[Series 4: axial in-out of · axial · 8.0mm · 0.74mm/px · z∈[-150,+42]mm · 3 of 42 slices shown]
[im 1/42]
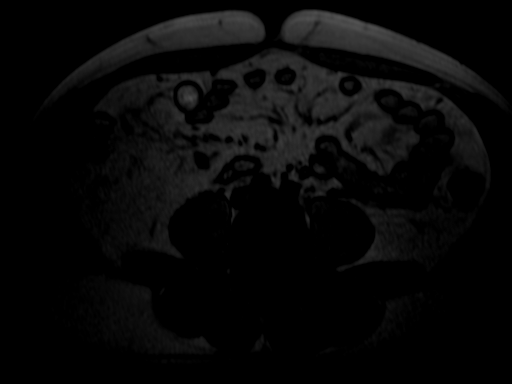
[im 21/42]
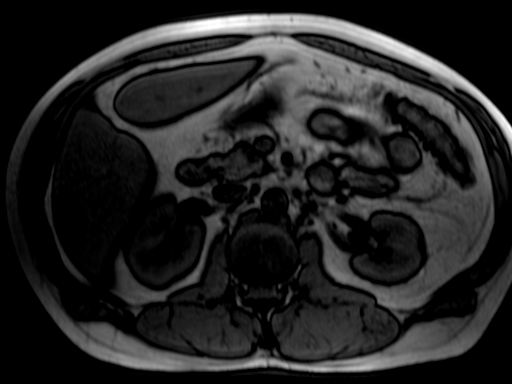
[im 42/42]
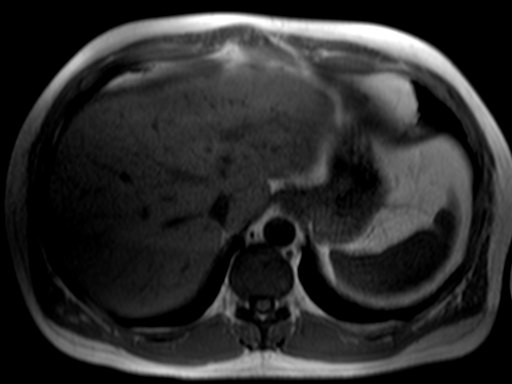

[Series 5: DWI · axial · 6.0mm · 2.97mm/px · z∈[-158,+51]mm · 6 of 90 slices shown]
[im 1/90]
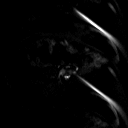
[im 18/90]
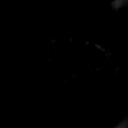
[im 36/90]
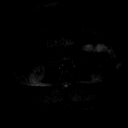
[im 54/90]
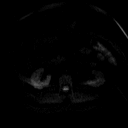
[im 72/90]
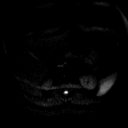
[im 90/90]
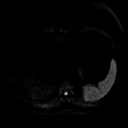

[Series 6: axial dwi_adc · axial · 6.0mm · 2.97mm/px · z∈[-158,+51]mm · 2 of 30 slices shown]
[im 1/30]
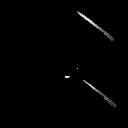
[im 30/30]
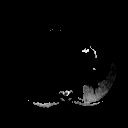

[Series 7: axial true fisp-- · axial · 4.0mm · 0.74mm/px · z∈[-138,+30]mm · 3 of 43 slices shown]
[im 1/43]
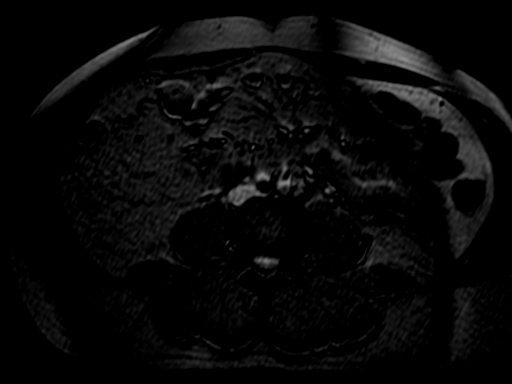
[im 22/43]
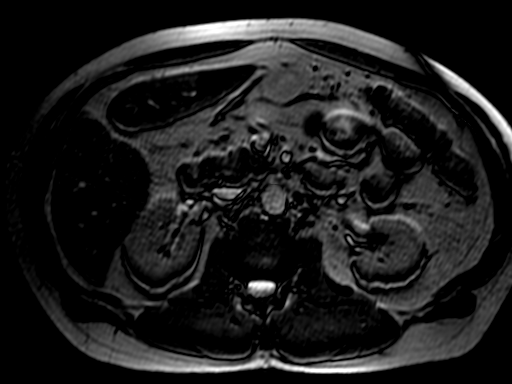
[im 43/43]
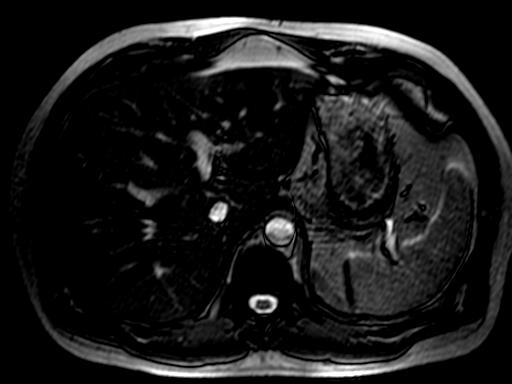

[Series 16: T2 post-contrast · axial · 8.0mm · 1.48mm/px · 1 of 21 slices shown]
[im 1/21]
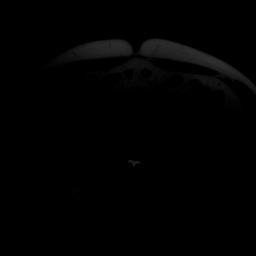

[18 of 48 positions shown; findings below may reference images not displayed]

FINDINGS: Lower chest: No acute findings.

Hepatobiliary: No masses identified. Gallbladder is unremarkable. No
evidence of biliary duct dilatation.

Pancreas:  No mass or inflammatory changes.

Spleen:  Within normal limits in size and appearance.

Adrenals/Urinary Tract: A 11 mm subcapsular lesion is seen in the
posterior lower pole of the left kidney. This shows T1
hyperintensity, T2 hypointensity, and no definite evidence of
contrast enhancement on subtraction imaging, although it is
difficult to characterize due to its small size. No other renal
lesions are identified. No evidence hydronephrosis. Right-sided
hydronephrosis has resolved since prior study.

Stomach/Bowel: Visualized portions within the abdomen are
unremarkable.

Vascular/Lymphatic: No pathologically enlarged lymph nodes
identified. Several small less than 1 cm retroperitoneal lymph nodes
are seen which are not pathologically enlarged. No abdominal aortic
aneurysm.

Other:  None.

Musculoskeletal: No suspicious bone lesions identified. Several
benign hemangiomas noted in the mid lower thoracic spine.
IMPRESSION: Probable 1 cm hemorrhagic cyst in lower pole of left kidney, which
is difficult to characterize due to its small size. Recommend
continued followup by abdomen MRI without and with contrast in 1
year to confirm stability.

Interval resolution of right hydronephrosis compared to previous CT.
